# Patient Record
Sex: Male | Born: 1995 | Race: White | Hispanic: No | Marital: Single | Smoking: Current some day smoker
Health system: Southern US, Community
[De-identification: ages and names within clinical notes are randomized; demographics above are authoritative.]

---
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
## 2019-01-07 ENCOUNTER — Emergency Department (HOSPITAL_BASED_OUTPATIENT_CLINIC_OR_DEPARTMENT_OTHER): Payer: Self-pay

## 2019-01-07 ENCOUNTER — Emergency Department (HOSPITAL_BASED_OUTPATIENT_CLINIC_OR_DEPARTMENT_OTHER)
Admission: EM | Admit: 2019-01-07 | Discharge: 2019-01-07 | Disposition: A | Discharge status: Home / Self Care | Payer: Self-pay | Attending: Emergency Medicine | Admitting: Emergency Medicine

## 2019-01-07 ENCOUNTER — Other Ambulatory Visit: Payer: Self-pay

## 2019-01-07 ENCOUNTER — Encounter (HOSPITAL_BASED_OUTPATIENT_CLINIC_OR_DEPARTMENT_OTHER): Payer: Self-pay

## 2019-01-07 DIAGNOSIS — S161XXA Strain of muscle, fascia and tendon at neck level, initial encounter: Secondary | ICD-10-CM | POA: Insufficient documentation

## 2019-01-07 DIAGNOSIS — Y92410 Unspecified street and highway as the place of occurrence of the external cause: Secondary | ICD-10-CM | POA: Diagnosis not present

## 2019-01-07 DIAGNOSIS — Y9389 Activity, other specified: Secondary | ICD-10-CM | POA: Diagnosis not present

## 2019-01-07 DIAGNOSIS — S63502A Unspecified sprain of left wrist, initial encounter: Secondary | ICD-10-CM | POA: Diagnosis not present

## 2019-01-07 DIAGNOSIS — Y998 Other external cause status: Secondary | ICD-10-CM | POA: Insufficient documentation

## 2019-01-07 DIAGNOSIS — S060X1A Concussion with loss of consciousness of 30 minutes or less, initial encounter: Secondary | ICD-10-CM | POA: Insufficient documentation

## 2019-01-07 DIAGNOSIS — S199XXA Unspecified injury of neck, initial encounter: Secondary | ICD-10-CM | POA: Diagnosis present

## 2019-01-07 MED ORDER — METHOCARBAMOL 500 MG PO TABS: 500.0000 mg | ORAL_TABLET | Freq: Three times a day (TID) | ORAL | 0 refills | Status: AC | PRN
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
## 2019-01-07 NOTE — ED Notes (Signed)
Patient transported to CT 
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
## 2019-06-07 ENCOUNTER — Ambulatory Visit
Admission: EM | Admit: 2019-06-07 | Discharge: 2019-06-07 | Disposition: A | Discharge status: Home / Self Care | Payer: Self-pay | Attending: Emergency Medicine | Admitting: Emergency Medicine

## 2019-06-07 ENCOUNTER — Other Ambulatory Visit: Payer: Self-pay

## 2019-06-07 DIAGNOSIS — R197 Diarrhea, unspecified: Secondary | ICD-10-CM

## 2019-06-07 DIAGNOSIS — Z20822 Contact with and (suspected) exposure to covid-19: Secondary | ICD-10-CM

## 2019-06-07 DIAGNOSIS — Z20828 Contact with and (suspected) exposure to other viral communicable diseases: Secondary | ICD-10-CM

## 2019-06-07 DIAGNOSIS — M791 Myalgia, unspecified site: Secondary | ICD-10-CM

## 2019-06-07 NOTE — ED Triage Notes (Signed)
Pt presents to UC stating he would like a covid test. Pt has had positive covid exposure. Pt has had diarrhea and body aches x3-4 days.

## 2019-06-07 NOTE — Discharge Instructions (Signed)
Your COVID test is pending - it is important to quarantine / isolate at home until your results are back. °If you test positive and would like further evaluation for persistent or worsening symptoms, you may schedule an E-visit or virtual (video) visit throughout the [facility] MyChart app or website. ° °PLEASE NOTE: If you develop severe chest pain or shortness of breath please go to the ER or call 9-1-1 for further evaluation --> DO NOT schedule electronic or virtual visits for this. °Please call our office for further guidance / recommendations as needed. °

## 2019-06-07 NOTE — ED Provider Notes (Signed)
[facility]    CSN: [id number] Arrival date & time: 06/07/19  1106      History   Chief Complaint Chief Complaint  Patient presents with  . covid test, body aches, diarrhea    HPI Kyle Braun is a 23 y.o. male   Presenting for Covid testing: Exposure: Coworkers girlfriend who does not positive last week-coworkers test pending, unsure if symptomatic. Date of exposure: PD, last exposure was Tuesday of last week Any fever, symptoms since exposure: Yes - looser stools without blood, melena, pain, abdominal pain, fevers x 3-4 days.  Also notes mild myalgias of arms, low back, legs.  No change in urination.  Not take anything for symptoms currently.   History reviewed. No pertinent past medical history.  There are no problems to display for this patient.   History reviewed. No pertinent surgical history.     Home Medications    Prior to Admission medications   Medication Sig Start Date End Date Taking? Authorizing Provider  cyclobenzaprine (FLEXERIL) 10 MG tablet Take 1 tablet (10 mg total) by mouth 2 (two) times daily as needed for muscle spasms. 12/01/13   Blane Ohara, MD  methocarbamol (ROBAXIN) 500 MG tablet Take 1 tablet (500 mg total) by mouth every 8 (eight) hours as needed for muscle spasms. 01/07/19   Benjiman Core, MD  naproxen (NAPROSYN) 500 MG tablet Take 1 tablet (500 mg total) by mouth 2 (two) times daily. 12/01/13   Blane Ohara, MD    Family History Family History  Problem Relation Age of Onset  . Healthy Mother   . Healthy Father     Social History Social History   Tobacco Use  . Smoking status: Never Smoker  . Smokeless tobacco: Never Used  Substance Use Topics  . Alcohol use: No  . Drug use: No     Allergies   Patient has no known allergies.   Review of Systems Review of Systems  Constitutional: Negative for fatigue and fever.  Respiratory: Negative for cough and shortness of breath.   Cardiovascular: Negative  for chest pain and palpitations.  Gastrointestinal: Negative for abdominal distention, abdominal pain, blood in stool, constipation, diarrhea, nausea, rectal pain and vomiting.       Positive for loose stools  Genitourinary: Negative for frequency and urgency.  Musculoskeletal: Positive for myalgias. Negative for arthralgias, back pain, gait problem, joint swelling, neck pain and neck stiffness.  Skin: Negative for rash and wound.  Neurological: Negative for speech difficulty and headaches.  All other systems reviewed and are negative.    Physical Exam Triage Vital Signs ED Triage Vitals  Enc Vitals Group     BP      Pulse      Resp      Temp      Temp src      SpO2      Weight      Height      Head Circumference      Peak Flow      Pain Score      Pain Loc      Pain Edu?      Excl. in GC?    No data found.  Updated Vital Signs BP 106/65 (BP Location: Left Arm)   Pulse 63   Temp 98.3 F (36.8 C)   Resp 16   SpO2 97%   Visual Acuity Right Eye Distance:   Left Eye Distance:   Bilateral Distance:    Right Eye  Near:   Left Eye Near:    Bilateral Near:     Physical Exam Constitutional:      General: He is not in acute distress.    Appearance: He is obese. He is not ill-appearing.  HENT:     Head: Normocephalic and atraumatic.     Mouth/Throat:     Mouth: Mucous membranes are moist.     Pharynx: Oropharynx is clear. No oropharyngeal exudate or posterior oropharyngeal erythema.  Eyes:     General: No scleral icterus.    Pupils: Pupils are equal, round, and reactive to light.  Cardiovascular:     Rate and Rhythm: Normal rate.  Pulmonary:     Effort: Pulmonary effort is normal. No respiratory distress.     Breath sounds: No wheezing.  Abdominal:     General: Bowel sounds are normal.     Tenderness: There is no abdominal tenderness.  Musculoskeletal:        General: Normal range of motion.     Right lower leg: No edema.     Left lower leg: No edema.    Skin:    Capillary Refill: Capillary refill takes less than 2 seconds.     Coloration: Skin is not jaundiced or pale.     Findings: No rash.  Neurological:     General: No focal deficit present.     Mental Status: He is alert and oriented to person, place, and time.      UC Treatments / Results  Labs (all labs ordered are listed, but only abnormal results are displayed) Labs Reviewed  NOVEL CORONAVIRUS, NAA    EKG   Radiology No results found.  Procedures Procedures (including critical care time)  Medications Ordered in UC Medications - No data to display  Initial Impression / Assessment and Plan / UC Course  I have reviewed the triage vital signs and the nursing notes.  Pertinent labs & imaging results that were available during my care of the patient were reviewed by me and considered in my medical decision making (see chart for details).     Patient afebrile, nontoxic, with SpO2 97%.  Covid PCR pending.  Patient to quarantine until results are back.  We will continue supportive management and oral hydration.  Discussed that patient should not take antidiarrheals at this time.  Return precautions discussed, patient verbalized understanding and is agreeable to plan. Final Clinical Impressions(s) / UC Diagnoses   Final diagnoses:  Exposure to COVID-19 virus  Myalgia  Diarrhea, unspecified type     Discharge Instructions     Your COVID test is pending - it is important to quarantine / isolate at home until your results are back. If you test positive and would like further evaluation for persistent or worsening symptoms, you may schedule an E-visit or virtual (video) visit throughout the [facility] app or website.  PLEASE NOTE: If you develop severe chest pain or shortness of breath please go to the ER or call 9-1-1 for further evaluation --> DO NOT schedule electronic or virtual visits for this. Please call our office for further guidance /  recommendations as needed.    ED Prescriptions    None     PDMP not reviewed this encounter.   Hall[place], [place], [place] 06/07/19 1141

## 2019-06-08 LAB — NOVEL CORONAVIRUS, NAA: SARS-CoV-2, NAA: NOT DETECTED

## 2020-07-10 ENCOUNTER — Encounter (HOSPITAL_BASED_OUTPATIENT_CLINIC_OR_DEPARTMENT_OTHER): Payer: Self-pay | Admitting: Emergency Medicine

## 2020-07-10 ENCOUNTER — Other Ambulatory Visit: Payer: Self-pay

## 2020-07-10 ENCOUNTER — Emergency Department (HOSPITAL_BASED_OUTPATIENT_CLINIC_OR_DEPARTMENT_OTHER)
Admission: EM | Admit: 2020-07-10 | Discharge: 2020-07-10 | Disposition: A | Discharge status: Home / Self Care | Payer: Self-pay | Attending: Emergency Medicine | Admitting: Emergency Medicine

## 2020-07-10 DIAGNOSIS — K649 Unspecified hemorrhoids: Secondary | ICD-10-CM

## 2020-07-10 DIAGNOSIS — F17210 Nicotine dependence, cigarettes, uncomplicated: Secondary | ICD-10-CM | POA: Insufficient documentation

## 2020-07-10 DIAGNOSIS — K648 Other hemorrhoids: Secondary | ICD-10-CM | POA: Insufficient documentation

## 2020-07-10 MED ORDER — WITCH HAZEL-GLYCERIN EX PADS: 1.0000 "application " | MEDICATED_PAD | CUTANEOUS | 12 refills | Status: AC | PRN

## 2020-07-10 MED ORDER — LIDOCAINE-EPINEPHRINE (PF) 2 %-1:200000 IJ SOLN
10.0000 mL | Freq: Once | INTRAMUSCULAR | Status: AC
  Administered 2020-07-10: 10 mL
  Filled 2020-07-10: qty 20

## 2020-07-10 MED ORDER — HYDROCORTISONE (PERIANAL) 2.5 % EX CREA
1.0000 | TOPICAL_CREAM | Freq: Two times a day (BID) | CUTANEOUS | 0 refills | Status: AC
Start: 2020-07-10 — End: ?

## 2020-07-10 NOTE — ED Triage Notes (Signed)
Pt reports hx of hemorrhoids, reports rectal bleeding x 1 week that increased 2 days pta.

## 2020-07-10 NOTE — Discharge Instructions (Signed)
If area begins to bleed again hold pressure.  Does not stop bleeding return here to ED or follow with the general surgeon.  I have written you for some which hazel pads as well as some Anusol which is a hydrocortisone cream for this area which may help with any pain or itching you may have.

## 2020-07-10 NOTE — ED Provider Notes (Signed)
[facility] EMERGENCY DEPARTMENT Provider Note   CSN: [id number] Arrival date & time: 07/10/20  1147     History Chief Complaint  Patient presents with  . Rectal Bleeding    Kyle Braun is a 25 y.o. male with patient past medical history who presents for evaluation of rectal bleeding.  Patient states he has had a hemorrhoid x1 week.  Patient noted when he had a bowel movement 2 days ago he strained and subsequently has had bleeding to this area.  Endorses pain to this area.  He denies any induration surrounding his rectum.  No purulent drainage from wound.  No difficulty with urination.  No fever, chills, nausea, vomiting, abdominal pain.  Pain, redness, swelling to testicles.  Denies additional aggravating or alleviating factors.  Rates pain a 2/10.   History obtained from patient and past medical records.  No interpreter used  HPI     No past medical history on file.  There are no problems to display for this patient.   No past surgical history on file.     Family History  Problem Relation Age of Onset  . Healthy Mother   . Healthy Father     Social History   Tobacco Use  . Smoking status: Current Some Day Smoker    Types: Cigarettes  . Smokeless tobacco: Never Used  Vaping Use  . Vaping Use: Some days  Substance Use Topics  . Alcohol use: Not Currently  . Drug use: No    Home Medications Prior to Admission medications   Medication Sig Start Date End Date Taking? Authorizing Provider  hydrocortisone (ANUSOL-HC) 2.5 % rectal cream Place 1 application rectally 2 (two) times daily. 07/10/20  Yes Derec Mozingo A, PA-C  witch hazel-glycerin (TUCKS) pad Apply 1 application topically as needed for itching. 07/10/20  Yes Dallan Schonberg A, PA-C  cyclobenzaprine (FLEXERIL) 10 MG tablet Take 1 tablet (10 mg total) by mouth 2 (two) times daily as needed for muscle spasms. 12/01/13   Blane Ohara, MD  methocarbamol (ROBAXIN) 500 MG tablet Take 1 tablet  (500 mg total) by mouth every 8 (eight) hours as needed for muscle spasms. 01/07/19   Benjiman Core, MD  naproxen (NAPROSYN) 500 MG tablet Take 1 tablet (500 mg total) by mouth 2 (two) times daily. 12/01/13   Blane Ohara, MD    Allergies    Patient has no known allergies.  Review of Systems   Review of Systems  Constitutional: Negative.   HENT: Negative.   Respiratory: Negative.   Cardiovascular: Negative.   Gastrointestinal: Negative.   Genitourinary: Negative.        Hemorrhoids   Musculoskeletal: Negative.   Neurological: Negative.   All other systems reviewed and are negative.   Physical Exam Updated Vital Signs BP 126/77 (BP Location: Right Arm)   Pulse 89   Temp 98.7 F (37.1 C) (Oral)   Resp 18   Ht 6\' 3"  (1.905 m)   Wt 107.9 kg   SpO2 100%   BMI 29.72 kg/m   Physical Exam Vitals and nursing note reviewed. Exam conducted with a chaperone present.  Constitutional:      General: He is not in acute distress.    Appearance: He is well-developed and well-nourished. He is not ill-appearing, toxic-appearing or diaphoretic.  HENT:     Head: Normocephalic and atraumatic.     Nose: Nose normal.     Mouth/Throat:     Mouth: Mucous membranes are moist.  Eyes:  Pupils: Pupils are equal, round, and reactive to light.  Cardiovascular:     Rate and Rhythm: Normal rate and regular rhythm.     Pulses: Normal pulses.     Heart sounds: Normal heart sounds.  Pulmonary:     Effort: Pulmonary effort is normal. No respiratory distress.     Breath sounds: Normal breath sounds.  Abdominal:     General: Bowel sounds are normal. There is no distension.     Palpations: Abdomen is soft.  Genitourinary:    Penis: Normal.      Testes: Normal.     Rectum: Guaiac result negative. Tenderness, external hemorrhoid and internal hemorrhoid present. No mass or anal fissure.       Comments: External hemorrhoid noted. Old, blood to surrounding perirectal tissue.  Possible small, is  of bright red blood.  No pulsatile bleeding.  No fluctuance or induration to perirectal area.  1, internal hemorrhoid. Musculoskeletal:        General: Normal range of motion.     Cervical back: Normal range of motion and neck supple.  Skin:    General: Skin is warm and dry.  Neurological:     Mental Status: He is alert.  Psychiatric:        Mood and Affect: Mood and affect normal.     ED Results / Procedures / Treatments   Labs (all labs ordered are listed, but only abnormal results are displayed) Labs Reviewed - No data to display  EKG None  Radiology No results found.  Procedures Wound repair  Date/Time: 07/10/2020 3:37 PM Performed by: Linwood Dibbles, PA-C Authorized by: Linwood Dibbles, PA-C  Consent: Verbal consent obtained. Written consent not obtained. Risks and benefits: risks, benefits and alternatives were discussed Consent given by: patient Patient understanding: patient states understanding of the procedure being performed Patient consent: the patient's understanding of the procedure matches consent given Procedure consent: procedure consent matches procedure scheduled Relevant documents: relevant documents present and verified Test results: test results available and properly labeled Site marked: the operative site was marked Imaging studies: imaging studies available Required items: required blood products, implants, devices, and special equipment available Patient identity confirmed: verbally with patient Time out: Immediately prior to procedure a "time out" was called to verify the correct patient, procedure, equipment, support staff and site/side marked as required. Local anesthesia used: yes  Anesthesia: Local anesthesia used: yes Local Anesthetic: lidocaine 1% with epinephrine and topical anesthetic (Soaked gauze) Anesthetic total: 10 mL  Sedation: Patient sedated: no  Patient tolerance: patient tolerated the procedure well with no  immediate complications      Medications Ordered in ED Medications  lidocaine-EPINEPHrine (XYLOCAINE W/EPI) 2 %-1:200000 (PF) injection 10 mL (10 mLs Infiltration Given by Other 07/10/20 1405)   ED Course  I have reviewed the triage vital signs and the nursing notes.  Pertinent labs & imaging results that were available during my care of the patient were reviewed by me and considered in my medical decision making (see chart for details).  Patient with hemorrhoid pain with possible bleeding.  On exam there is no evidence of gross perirectal, rectal abscess.  No surrounding erythema or warmth.  Does have evidence of external hemorrhoid with old stigmata of blood.  Possible one area with pinpoint area of bleeding however no pulsatile bleeding.  Does have 1, internal hemorrhoid noted.  Lidocaine with epi soaked gauze placed to hemorrhoid.  Patient observed for an hour and a half.  No bleeding.  Discussed symptomatic management for hemorrhoids at home.  He will follow-up with general surgery return here for any worsening symptoms    MDM Rules/Calculators/A&P                           Final Clinical Impression(s) / ED Diagnoses Final diagnoses:  Bleeding hemorrhoid    Rx / DC Orders ED Discharge Orders         Ordered    witch hazel-glycerin (TUCKS) pad  As needed        07/10/20 1535    hydrocortisone (ANUSOL-HC) 2.5 % rectal cream  2 times daily        07/10/20 1535           Martyn Timme A, PA-C 07/10/20 1537    Benjiman Core, MD 07/13/20 2327
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
## 2020-08-17 ENCOUNTER — Other Ambulatory Visit: Payer: Self-pay

## 2020-08-17 ENCOUNTER — Emergency Department (HOSPITAL_BASED_OUTPATIENT_CLINIC_OR_DEPARTMENT_OTHER)
Admission: EM | Admit: 2020-08-17 | Discharge: 2020-08-17 | Disposition: A | Discharge status: Home / Self Care | Payer: Self-pay | Attending: Emergency Medicine | Admitting: Emergency Medicine

## 2020-08-17 ENCOUNTER — Encounter (HOSPITAL_BASED_OUTPATIENT_CLINIC_OR_DEPARTMENT_OTHER): Payer: Self-pay | Admitting: Emergency Medicine

## 2020-08-17 DIAGNOSIS — K0889 Other specified disorders of teeth and supporting structures: Secondary | ICD-10-CM | POA: Insufficient documentation

## 2020-08-17 DIAGNOSIS — F17210 Nicotine dependence, cigarettes, uncomplicated: Secondary | ICD-10-CM | POA: Insufficient documentation

## 2020-08-17 DIAGNOSIS — K068 Other specified disorders of gingiva and edentulous alveolar ridge: Secondary | ICD-10-CM | POA: Insufficient documentation

## 2020-08-17 MED ORDER — CLINDAMYCIN HCL 150 MG PO CAPS: 300.0000 mg | ORAL_CAPSULE | Freq: Three times a day (TID) | ORAL | 0 refills | Status: AC

## 2020-08-17 NOTE — Discharge Instructions (Addendum)
You can take 600 mg of ibuprofen every 6 hours, you can take 1000 mg of Tylenol every 6 hours, you can alternate these every 3 or you can take them together.  

## 2020-08-17 NOTE — ED Triage Notes (Signed)
Reports hx of wisdom teeth coming in.  Having issues with pus pockets on left lower side intermittently.  Using oragel with some relief.

## 2020-08-17 NOTE — ED Provider Notes (Signed)
[facility] EMERGENCY DEPARTMENT Provider Note   CSN: [id number] Arrival date & time: 08/17/20  1219     History Chief Complaint  Patient presents with  . Dental Pain    Kyle Braun is a 25 y.o. male.   Dental Pain Location:  Lower Lower teeth location:  17/LL 3rd molar Quality:  Aching Severity:  Mild Onset quality:  Gradual Timing:  Constant Progression:  Waxing and waning Chronicity:  Recurrent Context: abscess   Previous work-up:  Dental exam Relieved by:  Nothing Worsened by:  Nothing Ineffective treatments:  None tried Associated symptoms: no congestion, no difficulty swallowing, no drooling, no facial pain, no facial swelling, no fever, no headaches and no neck swelling        History reviewed. No pertinent past medical history.  There are no problems to display for this patient.   History reviewed. No pertinent surgical history.     Family History  Problem Relation Age of Onset  . Healthy Mother   . Healthy Father     Social History   Tobacco Use  . Smoking status: Current Some Day Smoker    Types: Cigarettes  . Smokeless tobacco: Never Used  Vaping Use  . Vaping Use: Some days  Substance Use Topics  . Alcohol use: Not Currently  . Drug use: No    Home Medications Prior to Admission medications   Medication Sig Start Date End Date Taking? Authorizing Provider  clindamycin (CLEOCIN) 150 MG capsule Take 2 capsules (300 mg total) by mouth 3 (three) times daily for 7 days. 08/17/20 08/24/20 Yes Sabino Donovan, MD  cyclobenzaprine (FLEXERIL) 10 MG tablet Take 1 tablet (10 mg total) by mouth 2 (two) times daily as needed for muscle spasms. 12/01/13   Blane Ohara, MD  hydrocortisone (ANUSOL-HC) 2.5 % rectal cream Place 1 application rectally 2 (two) times daily. 07/10/20   Henderly, Britni A, PA-C  methocarbamol (ROBAXIN) 500 MG tablet Take 1 tablet (500 mg total) by mouth every 8 (eight) hours as needed for muscle spasms. 01/07/19    Benjiman Core, MD  naproxen (NAPROSYN) 500 MG tablet Take 1 tablet (500 mg total) by mouth 2 (two) times daily. 12/01/13   Blane Ohara, MD  witch hazel-glycerin (TUCKS) pad Apply 1 application topically as needed for itching. 07/10/20   Henderly, Britni A, PA-C    Allergies    Patient has no known allergies.  Review of Systems   Review of Systems  Constitutional: Negative for chills and fever.  HENT: Positive for dental problem. Negative for congestion, drooling, facial swelling and rhinorrhea.   Respiratory: Negative for cough and shortness of breath.   Cardiovascular: Negative for chest pain and palpitations.  Gastrointestinal: Negative for diarrhea, nausea and vomiting.  Genitourinary: Negative for difficulty urinating and dysuria.  Musculoskeletal: Negative for arthralgias and back pain.  Skin: Negative for color change and rash.  Neurological: Negative for light-headedness and headaches.    Physical Exam Updated Vital Signs BP 122/72 (BP Location: Right Arm)   Pulse 68   Temp 98.1 F (36.7 C) (Oral)   Resp 18   Ht 6\' 3"  (1.905 m)   Wt 104.3 kg   SpO2 99%   BMI 28.75 kg/m   Physical Exam Vitals and nursing note reviewed.  Constitutional:      General: He is not in acute distress.    Appearance: Normal appearance.  HENT:     Head: Normocephalic and atraumatic.     Comments: Patient has  poorly maintained dentition in the molars.  Some mild gingival inflammation, no fluctuant mass no trismus, no soft tissue mass in the neck normal range of motion of the neck.    Nose: No rhinorrhea.  Eyes:     General:        Right eye: No discharge.        Left eye: No discharge.     Conjunctiva/sclera: Conjunctivae normal.  Cardiovascular:     Rate and Rhythm: Normal rate and regular rhythm.  Pulmonary:     Effort: Pulmonary effort is normal.     Breath sounds: No stridor.  Abdominal:     General: Abdomen is flat. There is no distension.     Palpations: Abdomen is soft.   Musculoskeletal:        General: No deformity or signs of injury.  Skin:    General: Skin is warm and dry.  Neurological:     General: No focal deficit present.     Mental Status: He is alert. Mental status is at baseline.     Motor: No weakness.     Comments: No facial asymmetry, no cranial nerve abnormality.  Psychiatric:        Mood and Affect: Mood normal.        Behavior: Behavior normal.        Thought Content: Thought content normal.     ED Results / Procedures / Treatments   Labs (all labs ordered are listed, but only abnormal results are displayed) Labs Reviewed - No data to display  EKG None  Radiology No results found.  Procedures Procedures   Medications Ordered in ED Medications - No data to display  ED Course  I have reviewed the triage vital signs and the nursing notes.  Pertinent labs & imaging results that were available during my care of the patient were reviewed by me and considered in my medical decision making (see chart for details).    MDM Rules/Calculators/A&P                          Possible gingival infection related to poor dentition.  Patient states he is not able to get this tooth worked on until he has been treated with antibiotics.  He comments that this is the guidance given to him by his dentist.  He is given dental resources antibiotics as there is some mild inflammation of the gumline that could be related to infection.  Strict return precautions are given no signs of deep space infection safe for outpatient management and dental follow-up. Final Clinical Impression(s) / ED Diagnoses Final diagnoses:  Pain, dental    Rx / DC Orders ED Discharge Orders         Ordered    clindamycin (CLEOCIN) 150 MG capsule  3 times daily        08/17/20 1248           Sabino Donovan, MD 08/17/20 1259

## 2021-03-23 ENCOUNTER — Encounter (HOSPITAL_BASED_OUTPATIENT_CLINIC_OR_DEPARTMENT_OTHER): Payer: Self-pay | Admitting: *Deleted

## 2021-03-23 ENCOUNTER — Emergency Department (HOSPITAL_BASED_OUTPATIENT_CLINIC_OR_DEPARTMENT_OTHER)
Admission: EM | Admit: 2021-03-23 | Discharge: 2021-03-23 | Disposition: A | Discharge status: Home / Self Care | Payer: Medicaid Other | Attending: Emergency Medicine | Admitting: Emergency Medicine

## 2021-03-23 ENCOUNTER — Other Ambulatory Visit: Payer: Self-pay

## 2021-03-23 DIAGNOSIS — Z0000 Encounter for general adult medical examination without abnormal findings: Secondary | ICD-10-CM

## 2021-03-23 DIAGNOSIS — Z041 Encounter for examination and observation following transport accident: Secondary | ICD-10-CM | POA: Insufficient documentation

## 2021-03-23 DIAGNOSIS — F17210 Nicotine dependence, cigarettes, uncomplicated: Secondary | ICD-10-CM | POA: Insufficient documentation

## 2021-03-23 DIAGNOSIS — Y92410 Unspecified street and highway as the place of occurrence of the external cause: Secondary | ICD-10-CM | POA: Insufficient documentation

## 2021-03-23 DIAGNOSIS — Y9389 Activity, other specified: Secondary | ICD-10-CM | POA: Insufficient documentation
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
## 2021-05-01 ENCOUNTER — Other Ambulatory Visit: Payer: Self-pay

## 2021-05-01 ENCOUNTER — Emergency Department (HOSPITAL_BASED_OUTPATIENT_CLINIC_OR_DEPARTMENT_OTHER)
Admission: EM | Admit: 2021-05-01 | Discharge: 2021-05-01 | Disposition: A | Discharge status: Home Health | Payer: Medicaid Other | Attending: Emergency Medicine | Admitting: Emergency Medicine

## 2021-05-01 ENCOUNTER — Encounter (HOSPITAL_BASED_OUTPATIENT_CLINIC_OR_DEPARTMENT_OTHER): Payer: Self-pay

## 2021-05-01 DIAGNOSIS — R1030 Lower abdominal pain, unspecified: Secondary | ICD-10-CM | POA: Insufficient documentation

## 2021-05-01 DIAGNOSIS — R369 Urethral discharge, unspecified: Secondary | ICD-10-CM | POA: Insufficient documentation

## 2021-05-01 DIAGNOSIS — F17210 Nicotine dependence, cigarettes, uncomplicated: Secondary | ICD-10-CM | POA: Insufficient documentation

## 2021-05-01 DIAGNOSIS — R197 Diarrhea, unspecified: Secondary | ICD-10-CM | POA: Insufficient documentation

## 2021-05-01 DIAGNOSIS — R300 Dysuria: Secondary | ICD-10-CM | POA: Insufficient documentation

## 2021-05-01 LAB — URINALYSIS, ROUTINE W REFLEX MICROSCOPIC
Bilirubin Urine: NEGATIVE
Glucose, UA: NEGATIVE mg/dL
Hgb urine dipstick: NEGATIVE
Ketones, ur: NEGATIVE mg/dL
Leukocytes,Ua: NEGATIVE
Nitrite: NEGATIVE
Protein, ur: NEGATIVE mg/dL
Specific Gravity, Urine: >=1.03 (ref 1.005–1.030)
pH: 5.5 (ref 5.0–8.0)

## 2021-05-01 MED ORDER — DOXYCYCLINE HYCLATE 100 MG PO CAPS: 100.0000 mg | ORAL_CAPSULE | Freq: Two times a day (BID) | ORAL | 0 refills | Status: DC

## 2021-05-01 MED ORDER — DOXYCYCLINE HYCLATE 100 MG PO TABS
100.0000 mg | ORAL_TABLET | Freq: Once | ORAL | Status: AC
  Administered 2021-05-01: 100 mg via ORAL
  Filled 2021-05-01: qty 1

## 2021-05-01 MED ORDER — DOXYCYCLINE HYCLATE 100 MG PO CAPS: 100.0000 mg | ORAL_CAPSULE | Freq: Two times a day (BID) | ORAL | 0 refills | Status: AC

## 2021-05-01 NOTE — Discharge Instructions (Addendum)
We started you on treatment with doxycycline for possible chlamydia.  Your culture results may take a few days to confirm whether you do in fact have chlamydia or gonorrhea.  We should continue taking the full course of antibiotics and complete this in 1 week.  You should avoid any sexual contact for 2 weeks.  Please make sure that your sexual partners have been treated as well, and are aware that you tested positive.  *  Please take a few minutes to read these important discharge instructions below:  You have been diagnosed with and treated for a presumed sexually transmitted infection such as chlamydia, gonorrhea, or trichomoniasis.  Chlamydia and gonorrhea are the most commonly reported communicable diseases in the [place]. They are especially common among people younger than 25 years of age. Infection without symptoms is common in men and women. Your partner may be infected but not display symptoms.  Complications of these infections without treatment include serious pelvic infections, ectopic pregnancy, and infertility. Other infections such as HIV or HPV, which can occur in patients with sexually transmitted infections, can lead to cancer or death. To minimize the spread of the infection, you should avoid sexual intercourse for 7 days or until symptoms have resolved.  To minimize the spread of the infection, you should advise your sexual partner(s) to be evaluated, tested and treated. This includes all sexual partners within the past 60 days or your last sexual partner if last contact was greater than 60 days.  To minimize the risk of reinfection, you should abstain from sexual intercourse until your sexual partners have been tested and treated.  Consistent condom use is important in preventing the spread of sexually transmitted infections.  Do you need to get re-tested? Studies reveal that infection with chlamydia or gonorrhea is common among those treated within the preceding  several months. Testing for gonorrhea and chlamydia in approximately 3 months is recommended even if you believe your sexual partner has been treated. Based on your history or demographics, your doctor may feel that you are higher risk for HIV and syphilis.  Talk to your doctor about whether you should be tested for these diseases at this time.  When should you come back to the Emergency Department? If your symptoms have not improved after 48 hours from when you received your antibiotics, you should contact your primary care doctor or return to the Emergency Department.  These may be signs of an infection that has not been fully treated. If you develop worsening pain, worsening discharge from your penis or vagina, fevers of greater than 100.4 F, or have any other new or alarming symptoms, you should return promptly to the Emergency Department.  You should follow up with your primary care doctor's office in 1 week to ensure that your symptoms have resolved.

## 2021-05-01 NOTE — ED Triage Notes (Signed)
Pt c/o lower abdominal pain intermittently x 4 days. States known encounter with STD. Thinks it is chlamydia. Denies penile discharge. C/o diarrhea

## 2021-05-01 NOTE — ED Provider Notes (Signed)
[facility] EMERGENCY DEPARTMENT Provider Note   CSN: [id number] Arrival date & time: 05/01/21  4097     History Chief Complaint  Patient presents with   Abdominal Pain    Kyle Braun is a 25 y.o. male presented to ED with lower abdominal pain and dysuria and penile discharge.  The patient reports that he is concerned about exposure to chlamydia.  He reports that his sexual partner contacted him 3 days ago and told him that she had tested positive for chlamydia.  She is currently being treated.    He himself has never been diagnosed with chlamydia or gonorrhea.  He does report some cramping lower abdominal pain, some discomfort in his testicles, some diarrhea.  He also reports a thin, watery type discharge from the penis.  He denies any other medical problems.  He reports that she did not test positive for gonorrhea.  HPI     History reviewed. No pertinent past medical history.  There are no problems to display for this patient.   History reviewed. No pertinent surgical history.     Family History  Problem Relation Age of Onset   Healthy Mother    Healthy Father     Social History   Tobacco Use   Smoking status: Some Days    Types: Cigarettes   Smokeless tobacco: Never  Vaping Use   Vaping Use: Former  Substance Use Topics   Alcohol use: Not Currently   Drug use: No    Home Medications Prior to Admission medications   Medication Sig Start Date End Date Taking? Authorizing Provider  cyclobenzaprine (FLEXERIL) 10 MG tablet Take 1 tablet (10 mg total) by mouth 2 (two) times daily as needed for muscle spasms. 12/01/13   Blane Ohara, MD  doxycycline (VIBRAMYCIN) 100 MG capsule Take 1 capsule (100 mg total) by mouth 2 (two) times daily for 7 days. 05/01/21 05/08/21  Terald Sleeper, MD  hydrocortisone (ANUSOL-HC) 2.5 % rectal cream Place 1 application rectally 2 (two) times daily. 07/10/20   Henderly, Britni A, PA-C  methocarbamol (ROBAXIN) 500 MG  tablet Take 1 tablet (500 mg total) by mouth every 8 (eight) hours as needed for muscle spasms. 01/07/19   Benjiman Core, MD  naproxen (NAPROSYN) 500 MG tablet Take 1 tablet (500 mg total) by mouth 2 (two) times daily. 12/01/13   Blane Ohara, MD  witch hazel-glycerin (TUCKS) pad Apply 1 application topically as needed for itching. 07/10/20   Henderly, Britni A, PA-C    Allergies    Patient has no known allergies.  Review of Systems   Review of Systems  Constitutional:  Negative for chills and fever.  Respiratory:  Negative for cough and shortness of breath.   Cardiovascular:  Negative for chest pain and palpitations.  Gastrointestinal:  Positive for abdominal pain and diarrhea.  Genitourinary:  Positive for dysuria, penile discharge and testicular pain. Negative for hematuria, penile swelling and scrotal swelling.  Musculoskeletal:  Negative for arthralgias and back pain.  Skin:  Negative for color change and rash.  All other systems reviewed and are negative.  Physical Exam Updated Vital Signs BP 123/63 (BP Location: Right Arm)   Pulse (!) 58   Temp 97.6 F (36.4 C) (Oral)   Resp 16   Ht 6\' 3"  (1.905 m)   Wt 108.9 kg   SpO2 100%   BMI 30.00 kg/m   Physical Exam Constitutional:      General: He is not in acute distress. HENT:  Head: Normocephalic and atraumatic.  Eyes:     Conjunctiva/sclera: Conjunctivae normal.     Pupils: Pupils are equal, round, and reactive to light.  Cardiovascular:     Rate and Rhythm: Normal rate and regular rhythm.  Abdominal:     General: There is no distension.     Tenderness: There is no abdominal tenderness.  Genitourinary:    Penis: Normal.      Testes: Normal. Cremasteric reflex is present.        Right: Mass or tenderness not present.        Left: Mass or tenderness not present.  Skin:    General: Skin is warm and dry.  Neurological:     Mental Status: He is alert. Mental status is at baseline.  Psychiatric:        Mood  and Affect: Mood normal.        Behavior: Behavior normal.    ED Results / Procedures / Treatments   Labs (all labs ordered are listed, but only abnormal results are displayed) Labs Reviewed  URINALYSIS, ROUTINE W REFLEX MICROSCOPIC  GC/CHLAMYDIA PROBE AMP ([facility]) NOT AT [facility]    EKG None  Radiology No results found.  Procedures Procedures   Medications Ordered in ED Medications  doxycycline (VIBRA-TABS) tablet 100 mg (100 mg Oral Given 05/01/21 0959)    ED Course  I have reviewed the triage vital signs and the nursing notes.  Pertinent labs & imaging results that were available during my care of the patient were reviewed by me and considered in my medical decision making (see chart for details).  Patient is here with suspected STI.  His partner tested positive for chlamydia, not gonorrhea.  He does have some penile discharge.  No evidence of orchitis, epididymitis per his GU exam.  I do not see any obvious lesions or rashes to suspect syphilis.  I think it is reasonable to send a urine sample for chlamydia gonorrhea and treat empirically for chlamydia.  We will start him on doxycycline here.  UA is pending.  Anticipate discharge on doxycycline while awaiting his results     Final Clinical Impression(s) / ED Diagnoses Final diagnoses:  Penile discharge    Rx / DC Orders ED Discharge Orders          Ordered    doxycycline (VIBRAMYCIN) 100 MG capsule  2 times daily,   Status:  Discontinued        05/01/21 0952    doxycycline (VIBRAMYCIN) 100 MG capsule  2 times daily        05/01/21 1026             Wyvonnia Dusky, MD 05/01/21 [phone]

## 2023-12-17 ENCOUNTER — Encounter (HOSPITAL_BASED_OUTPATIENT_CLINIC_OR_DEPARTMENT_OTHER): Payer: Self-pay

## 2023-12-17 ENCOUNTER — Other Ambulatory Visit: Payer: Self-pay

## 2023-12-17 ENCOUNTER — Emergency Department (HOSPITAL_BASED_OUTPATIENT_CLINIC_OR_DEPARTMENT_OTHER)
Admission: EM | Admit: 2023-12-17 | Discharge: 2023-12-17 | Disposition: A | Discharge status: Home / Self Care | Attending: Emergency Medicine | Admitting: Emergency Medicine

## 2023-12-17 DIAGNOSIS — Z202 Contact with and (suspected) exposure to infections with a predominantly sexual mode of transmission: Secondary | ICD-10-CM | POA: Insufficient documentation

## 2023-12-17 DIAGNOSIS — R1030 Lower abdominal pain, unspecified: Secondary | ICD-10-CM | POA: Insufficient documentation

## 2023-12-17 LAB — URINALYSIS, ROUTINE W REFLEX MICROSCOPIC
Bilirubin Urine: NEGATIVE
Glucose, UA: NEGATIVE mg/dL
Hgb urine dipstick: NEGATIVE
Ketones, ur: NEGATIVE mg/dL
Leukocytes,Ua: NEGATIVE
Nitrite: NEGATIVE
Protein, ur: NEGATIVE mg/dL
Specific Gravity, Urine: 1.025 (ref 1.005–1.030)
pH: 5.5 (ref 5.0–8.0)

## 2023-12-17 MED ORDER — LIDOCAINE HCL (PF) 1 % IJ SOLN
1.0000 mL | Freq: Once | INTRAMUSCULAR | Status: AC
  Administered 2023-12-17: 2 mL
  Filled 2023-12-17: qty 5

## 2023-12-17 MED ORDER — CEFTRIAXONE SODIUM 500 MG IJ SOLR
500.0000 mg | Freq: Once | INTRAMUSCULAR | Status: AC
  Administered 2023-12-17: 500 mg via INTRAMUSCULAR
  Filled 2023-12-17: qty 500

## 2023-12-17 MED ORDER — DOXYCYCLINE HYCLATE 100 MG PO TABS: 100.0000 mg | ORAL_TABLET | Freq: Two times a day (BID) | ORAL | 0 refills | Status: AC

## 2023-12-17 NOTE — ED Provider Notes (Signed)
 [place] [facility]ERGENC[facility] DEPARTMENT AT [facility] Provider Note   CSN: [id number] Arrival date & time: 12/17/23  1306     Patient presents with: Exposure to STD   Kyle Braun is a 28 y.o. male with overall noncontributory past medical history who presents with concern for STD exposure.  Patient reports that he had sexual contact with a woman, after the fact she reported that she was positive for both chlamydia and gonorrhea.  He reports some mild lower abdominal pain, denies any penile discharge. No fever, chills.    Exposure to STD       Prior to Admission medications   Medication Sig Start Date End Date Taking? Authorizing Provider  doxycycline  (VIBRA -TABS) 100 MG tablet Take 1 tablet (100 mg total) by mouth 2 (two) times daily. 12/17/23  Yes Linsy Ehresman H, PA-C  cyclobenzaprine  (FLEXERIL ) 10 MG tablet Take 1 tablet (10 mg total) by mouth 2 (two) times daily as needed for muscle spasms. 12/01/13   Tonia Chew, MD  hydrocortisone  (ANUSOL -HC) 2.5 % rectal cream Place 1 application rectally 2 (two) times daily. 07/10/20   Henderly, Britni A, PA-C  methocarbamol  (ROBAXIN ) 500 MG tablet Take 1 tablet (500 mg total) by mouth every 8 (eight) hours as needed for muscle spasms. 01/07/19   Patsey Lot, MD  naproxen  (NAPROSYN ) 500 MG tablet Take 1 tablet (500 mg total) by mouth 2 (two) times daily. 12/01/13   Tonia Chew, MD  witch hazel-glycerin  (TUCKS) pad Apply 1 application topically as needed for itching. 07/10/20   Henderly, Britni A, PA-C    Allergies: Patient has no known allergies.    Review of Systems  All other systems reviewed and are negative.   Updated Vital Signs BP 134/84 (BP Location: Left Arm)   Pulse 68   Temp 98.1 F (36.7 C) (Oral)   Resp 16   Ht 6' 4 (1.93 m)   Wt 124.7 kg   SpO2 96%   BMI 33.47 kg/m   Physical Exam Vitals and nursing note reviewed.  Constitutional:      General: He is not in acute distress.    Appearance:  Normal appearance.  HENT:     Head: Normocephalic and atraumatic.  Eyes:     General:        Right eye: No discharge.        Left eye: No discharge.  Cardiovascular:     Rate and Rhythm: Normal rate and regular rhythm.     Heart sounds: No murmur heard.    No friction rub. No gallop.  Pulmonary:     Effort: Pulmonary effort is normal.     Breath sounds: Normal breath sounds.  Abdominal:     General: Bowel sounds are normal.     Palpations: Abdomen is soft.  Genitourinary:    Comments: Declines GU exam, denies discharge at this time Skin:    General: Skin is warm and dry.     Capillary Refill: Capillary refill takes less than 2 seconds.  Neurological:     Mental Status: He is alert and oriented to person, place, and time.  Psychiatric:        Mood and Affect: Mood normal.        Behavior: Behavior normal.     (all labs ordered are listed, but only abnormal results are displayed) Labs Reviewed  URINALYSIS, ROUTINE W REFLEX MICROSCOPIC  GC/CHLAMYDIA PROBE AMP ([facility]) NOT AT [facility]    EKG: None  Radiology: No results  found.   Procedures   Medications Ordered in the ED  cefTRIAXone  (ROCEPHIN ) injection 500 mg (has no administration in time range)  lidocaine  (PF) (XYLOCAINE ) 1 % injection 1-2.1 mL (has no administration in time range)                                    Medical Decision Making Amount and/or Complexity of Data Reviewed Labs: ordered.   This is an overall well-appearing 28 year old male who presents concern for STD exposure.  He reports that his last sexual partner was positive for both gonorrhea and chlamydia by test.  He reports some very mild lower abdominal pain, denies any penile discharge.  He is requesting treatment after exposure.  Discussed prophylactic treatment versus waiting for results.  Given that he did have positive exposure I think is reasonable to treat presumptively with Rocephin , doxycycline , encouraged him to follow-up on his  results nonetheless and to inform all of the sexual partners.  Encouraged him to refrain from sexual activity until completing his entire course of treatment.  Discussed extensive return precautions for worsening symptoms despite completing treatment.  Final diagnoses:  STD exposure    ED Discharge Orders          Ordered    doxycycline  (VIBRA -TABS) 100 MG tablet  2 times daily        12/17/23 1352               Frederika Hukill, [place] H, PA-C 12/17/23 1357    Tegeler, Lonni PARAS, MD 12/17/23 [phone]

## 2023-12-17 NOTE — ED Triage Notes (Signed)
 Pt reports recent exposure to chlamydia and gonorrhea. Mild lower abdominal pain reported. Denies any other symptoms.

## 2023-12-17 NOTE — Discharge Instructions (Addendum)
 Please follow-up on the results on your patient portal, please take the entire course of antibiotics as prescribed.  Please inform any sexual partners of your results if it comes back positive for either chlamydia, gonorrhea.  I encourage you to use safe sex practices including condoms, and discussion with your partners about sexually transmitted infections before sexual contact.  Please follow-up with the health department if you continue to have any symptoms despite finishing treatment.

## 2023-12-17 NOTE — ED Notes (Signed)
 Pt alert and oriented X 4 at the time of discharge. RR even and unlabored. No acute distress noted. Pt verbalized understanding of discharge instructions as discussed. Pt ambulatory to lobby at time of discharge.

## 2023-12-18 LAB — GC/CHLAMYDIA PROBE AMP (~~LOC~~) NOT AT ARMC
Chlamydia: NEGATIVE
Comment: NEGATIVE
Comment: NORMAL
Neisseria Gonorrhea: NEGATIVE
